# Patient Record
Sex: Female | Born: 1960 | Race: White | Hispanic: No
Health system: Southern US, Community
[De-identification: ages and names within clinical notes are randomized; demographics above are authoritative.]

---

## 2005-10-30 ENCOUNTER — Ambulatory Visit: Payer: Self-pay | Admitting: Internal Medicine

## 2005-10-30 ENCOUNTER — Inpatient Hospital Stay (HOSPITAL_BASED_OUTPATIENT_CLINIC_OR_DEPARTMENT_OTHER): Admission: RE | Admit: 2005-10-30 | Discharge: 2005-10-30 | Payer: Self-pay | Admitting: Cardiology

## 2010-09-03 ENCOUNTER — Encounter (HOSPITAL_COMMUNITY)
Admission: RE | Admit: 2010-09-03 | Discharge: 2010-09-03 | Disposition: A | Discharge status: Home / Self Care | Payer: BC Managed Care – PPO | Source: Ambulatory Visit | Attending: Obstetrics and Gynecology | Admitting: Obstetrics and Gynecology

## 2010-09-03 DIAGNOSIS — Z01812 Encounter for preprocedural laboratory examination: Secondary | ICD-10-CM | POA: Insufficient documentation

## 2010-09-03 LAB — CBC
MCHC: 32.9 g/dL (ref 30.0–36.0)
Platelets: 333 10*3/uL (ref 150–400)
RDW: 14 % (ref 11.5–15.5)
WBC: 10.2 10*3/uL (ref 4.0–10.5)

## 2010-09-03 LAB — URINALYSIS, ROUTINE W REFLEX MICROSCOPIC
Leukocytes, UA: NEGATIVE
Nitrite: NEGATIVE
Protein, ur: NEGATIVE mg/dL
Specific Gravity, Urine: 1.015 (ref 1.005–1.030)
Urobilinogen, UA: 0.2 mg/dL (ref 0.0–1.0)

## 2010-09-03 LAB — COMPREHENSIVE METABOLIC PANEL
Alkaline Phosphatase: 70 U/L (ref 39–117)
BUN: 6 mg/dL (ref 6–23)
Creatinine, Ser: 0.74 mg/dL (ref 0.4–1.2)
Glucose, Bld: 98 mg/dL (ref 70–99)
Potassium: 3.8 mEq/L (ref 3.5–5.1)
Total Protein: 7.4 g/dL (ref 6.0–8.3)

## 2010-09-03 LAB — SURGICAL PCR SCREEN
MRSA, PCR: NEGATIVE
Staphylococcus aureus: NEGATIVE

## 2010-09-03 LAB — URINE MICROSCOPIC-ADD ON

## 2010-09-10 ENCOUNTER — Inpatient Hospital Stay (HOSPITAL_COMMUNITY)
Admission: RE | Admit: 2010-09-10 | Discharge: 2010-09-12 | DRG: 359 | Disposition: A | Discharge status: Home / Self Care | Payer: BC Managed Care – PPO | Source: Ambulatory Visit | Attending: Obstetrics and Gynecology | Admitting: Obstetrics and Gynecology

## 2010-09-10 ENCOUNTER — Other Ambulatory Visit: Payer: Self-pay | Admitting: Obstetrics and Gynecology

## 2010-09-10 DIAGNOSIS — N949 Unspecified condition associated with female genital organs and menstrual cycle: Secondary | ICD-10-CM | POA: Diagnosis present

## 2010-09-10 DIAGNOSIS — N8000 Endometriosis of the uterus, unspecified: Secondary | ICD-10-CM | POA: Diagnosis present

## 2010-09-10 DIAGNOSIS — N920 Excessive and frequent menstruation with regular cycle: Secondary | ICD-10-CM | POA: Diagnosis present

## 2010-09-10 DIAGNOSIS — D251 Intramural leiomyoma of uterus: Secondary | ICD-10-CM | POA: Diagnosis present

## 2010-09-10 DIAGNOSIS — D252 Subserosal leiomyoma of uterus: Principal | ICD-10-CM | POA: Diagnosis present

## 2010-09-10 DIAGNOSIS — Z01812 Encounter for preprocedural laboratory examination: Secondary | ICD-10-CM

## 2010-09-10 DIAGNOSIS — N80209 Endometriosis of unspecified fallopian tube, unspecified depth: Secondary | ICD-10-CM | POA: Diagnosis present

## 2010-09-10 DIAGNOSIS — N802 Endometriosis of fallopian tube: Secondary | ICD-10-CM | POA: Diagnosis present

## 2010-09-10 LAB — PREGNANCY, URINE: Preg Test, Ur: NEGATIVE

## 2010-09-11 LAB — BASIC METABOLIC PANEL
Calcium: 8.3 mg/dL — ABNORMAL LOW (ref 8.4–10.5)
Creatinine, Ser: 0.6 mg/dL (ref 0.4–1.2)
GFR calc Af Amer: >60 mL/min (ref >60)
Sodium: 134 mEq/L — ABNORMAL LOW (ref 135–145)

## 2010-09-11 LAB — CBC
MCH: 30.4 pg (ref 26.0–34.0)
MCHC: 32.4 g/dL (ref 30.0–36.0)
Platelets: 298 10*3/uL (ref 150–400)
RBC: 3.68 MIL/uL — ABNORMAL LOW (ref 3.87–5.11)

## 2010-09-27 NOTE — Op Note (Signed)
NAMEDAVEN, PINCKNEY             ACCOUNT NO.:  [id number]  MEDICAL RECORD NO.:  [id number]           PATIENT TYPE:  I  LOCATION:  9319                          FACILITY:  [facility]  PHYSICIAN:  Randye Lobo, M.D.   DATE OF BIRTH:  1961-05-01  DATE OF PROCEDURE:  09/10/2010 DATE OF DISCHARGE:                              OPERATIVE REPORT   PREOPERATIVE DIAGNOSIS:  Symptomatic uterine fibroids.  POSTOPERATIVE DIAGNOSIS:  Symptomatic uterine fibroids.  PROCEDURE:  Total abdominal hysterectomy with bilateral salpingo- oophorectomy.  SURGEON:  Randye Lobo, MD  ASSISTANT:  Luvenia Redden, MD  ANESTHESIA:  Spinal epidural.  IV FLUIDS:  2000 mL Ringer's lactate.  ESTIMATED BLOOD LOSS:  150 mL.  URINE OUTPUT:  225 mL.  COMPLICATIONS:  None.  INDICATIONS FOR PROCEDURE:  The patient is a 50 year old para 2 Caucasian female, status post bilateral tubal ligation who has known uterine fibroids and who now presents with painful and heavy menstruation.  The patient has failed Lysteda treatment.  Recently, she has developed prolonged and heavy bleeding.  An ultrasound documented a multi-fibroid uterus.  The endometrial stripe was 12.4 mm.  The ovaries were not well visualized.  The patient's hemoglobin was 13.5 and her platelet count was 433,000.  Her uterus on examination felt consistent with approximately 13 weeks' size uterus.  It was not possible to palpate the ovaries.  The cervix was distorted and felt flash with the vagina and the os could not be identified in order to perform a preoperative endometrial biopsy.  Plan is now made to proceed with a total abdominal hysterectomy with possible bilateral salpingo- oophorectomy after risks, benefits, and alternatives have been reviewed.  FINDINGS:  The patient had a multi-fibroid uterus weighing 825.5 g. There was endometriosis of the uterine serosa in different locations. The fallopian tubes were consistent with prior bilateral  tubal ligation, but were otherwise normal.  The ovaries were unremarkable.  There was no evidence of any adhesive disease nor endometriosis in the abdomen or the pelvis.  The liver, gallbladder, spleen, and kidneys palpated to be normal.  SPECIMEN:  The uterus, cervix, bilateral tubes and ovaries were sent to pathology.  PROCEDURE:  The patient was reidentified in the preoperative hold area. She received cefotetan IV for antibiotic prophylaxis and she received TED hose and PAS stockings for DVT prophylaxis.  The patient was taken to the operating room where a spinal epidural combine anesthetic was administered.  The patient was placed in the dorsal lithotomy position.  The abdomen and vagina were then sterilely prepped and a Foley catheter was placed in the bladder.  She was sterilely draped.  The procedure began by creating a Pfannenstiel incision with a scalpel. Dissection was carried down to the fascia using monopolar cautery.  The fascia was incised in the midline with a scalpel and the incision was extended with the Mayo scissors bilaterally.  The rectus muscles were dissected off of the fascia superiorly and inferiorly.  The rectus muscles were sharply divided in the midline.  The parietal peritoneum was elevated with two hemostat clamps and entered sharply.  The peritoneal incision was extended cranially  and caudally.  An examination of the pelvis was performed.  A self-retaining retractor was placed.  The bowel was packed into the upper abdomen with moistened lap pads.  The procedure began with grasping the adnexal structures in the round ligament on the patient's left-hand side.  The left round ligament was then ligated with a transfixing suture of 0 Vicryl.  The ligament was bisected with monopolar cautery.  The broad ligament was opened anteriorly and posteriorly with monopolar cautery.  The ureter was identified in the left-hand side.  The left infundibulopelvic  ligament was doubly clamped and sharply divided.  The pedicle was tied with a free tie of 0 Vicryl followed by suture ligature of the same.  The bladder flap was taken down using monopolar cautery on the patient's left-hand side.  The same procedure that was performed on the left-hand side was then repeated on the right-hand side again after the right ureter was identified.  The bladder flap was further taken down in the midline anteriorly.  The uterine arteries were each skeletonized.  The fundus was delivered up through the uterine incision.  The uterine arteries were then doubly clamped, sharply divided, and suture ligated with 0 Vicryl bilaterally.  Amputation of the fundus from the cervix was performed at this time with monopolar cautery and the uterine fundus was set aside.  The remaining aspects of the cardinal ligaments were then clamped, sharply divided and suture ligated with transfixing sutures of 0 Vicryl. The dissection was carried down to the uterosacral ligaments, which were then clamped with a curved Heaney clamp, sharply divided, and suture ligated on each side again with 0 Vicryl.  Entry into the vagina was successful at this point.  A Jorgenson scissors was used to circumscribe the remaining cervix from the vagina.  The cervix was then sent to pathology with the remaining specimen.  Angle sutures of 0 Vicryl were created bilaterally and the vaginal cuff was then closed with a combination of figure-of-eight sutures of 0 Vicryl and then one simple suture to close the final aspect of the cuff.  Warm irrigation was then used to irrigate the pelvis.  Operative sites were hemostatic.  The abdomen was closed.  The lap pads and the self-retaining retractor were removed.  An exploration of the upper abdomen was performed and the findings are as noted above.  The parietal peritoneum was closed with a running suture of 3-0 Vicryl. The rectus muscles were reapproximated in  the midline with simple interrupted sutures of 0 Vicryl.  The fascia was closed with 0 Vicryl. The subcutaneous layer was closed with interrupted sutures of 2-0 plain gut suture.  The skin was closed with a subcuticular suture of 3-0 Vicryl.  Octylseal was placed over the skin incision.  The patient was taken out of the lithotomy position and escorted to the recovery room in stable and awake condition.  There were no complications to the procedure.  All needle, instrument, and sponge counts were correct.     Randye Lobo, M.D.     BES/MEDQ  D:  09/10/2010  T:  09/11/2010  Job:  [id number]  Electronically Signed by Conley Simmonds M.D. on 09/26/2010 10:59:15 AM

## 2010-09-27 NOTE — Discharge Summary (Signed)
[name], [name]             ACCOUNT NO.:  [id number]  MEDICAL RECORD NO.:  [id number]           PATIENT TYPE:  I  LOCATION:  9319                          [facility]ACILITY:  [facility]  PHYSICIAN:  Randye Lobo, M.D.   DATE OF BIRTH:  November 11, 1960  DATE OF ADMISSION:  09/10/2010 DATE OF DISCHARGE:  09/12/2010                              DISCHARGE SUMMARY   ADMISSION DIAGNOSIS:  Symptomatic uterine fibroids.  DISCHARGE DIAGNOSES: 1. Symptomatic uterine fibroids. 2. Suspected endometriosis. 3. Status post total abdominal hysterectomy with bilateral salpingo-     oophorectomy.  SIGNIFICANT OPERATIONS AND PROCEDURES:  The patient underwent a total abdominal hysterectomy with bilateral salpingo-oophorectomy on September 10, 2010, at the [facility] under the direction of Dr. Conley Simmonds with the assistance of Dr. Lodema Hong.  ADMISSION HISTORY AND PHYSICAL EXAMINATION:  The patient is a 50 year old para 2 Caucasian female, status post bilateral tubal ligation, who has known uterine fibroids and has been treated with Lysteda therapy, who presents with painful and heavy menstruation.  The patient is now having continuous vaginal bleeding.  Ultrasound documents a multi- fibroid uterus.  The endometrial stripe measured 12.4 mm and the ovaries were not visualized.  The patient's hemoglobin was 13.5.  TSH was normal.  The patient requested hysterectomy procedure after reviewing options.  The patient's medical history is significant for chronic bronchitis and tobacco use.  PHYSICAL EXAMINATION:  PELVIC:  The patient is noted to have a 12-13 weeks' size uterus.  The cervix was shortened and flushed with the vagina, making an endometrial biopsy not possible.  There were no adnexal masses appreciated.  HOSPITAL COURSE:  The patient was admitted on September 10, 2010, at which time, she underwent a total abdominal hysterectomy with bilateral salpingo-oophorectomy which was  performed without complications. Findings at the time of surgery included a multi-fibroid uterus, fallopian tubes consistent with a prior bilateral tubal ligation, and blue lesions of endometriosis scattered over the uterine serosal surface.  Postoperatively, the patient had a benign surgical recovery.  She was slowly weaned off her O2 therapy.  Her oxygen saturation on room air at this time is 91% and she is in no acute distress.  Her lungs are clear to auscultation.  The patient had control of her pain postoperatively with an epidural which was placed at the time of surgery.  She was converted over to oral pain medication on postop day #1 and she is tolerating this well.  The patient's diet is advanced to normal.  She is ambulating independently. She has had returned bladder and bowel function.  The patient's incision remains clean, dry, and intact.  Her postop day #1 hemoglobin is 11.2 and she is tolerating this well.  The patient's final pathology report is pending at the time of her discharge.  The patient is found to be in good condition and ready for discharge on postop day #2.  INSTRUCTIONS AT DISCHARGE: 1. Discharge to home. 2. The patient will follow a regular diet. 3. The patient will take the following medications:     a.     Percocet 5 mg/325 mg 1-2 p.o.  q.4-6 h. p.r.n. pain.     b.     Ibuprofen 600 mg p.o. q.6 h. p.r.n. pain. 4. The patient will have decreased activity for 6 weeks.  She will not     drive for 2 weeks.  She will not have sexual activity for 6 weeks. 5. The patient will follow up in the office in 4 weeks' time for her     postoperative check and for assessment for estrogen therapy. 6. The patient will call if she experiences problems with fever,     nausea and vomiting, pain uncontrolled by her medication,     incisional drainage or redness, or any other concern.     Randye Lobo, M.D.     BES/MEDQ  D:  09/12/2010  T:  09/12/2010  Job:   [id number]  Electronically Signed by Conley Simmonds M.D. on 09/26/2010 09:03:16 AM

## 2010-09-27 NOTE — H&P (Signed)
[name], [name]             ACCOUNT NO.:  [id number]  MEDICAL RECORD NO.:  [id number]         PATIENT TYPE:  WAMB  LOCATION:                                [facility]ILITY[facility]  PHYSICIAN:  Randye Lobo, M.D.   DATE OF BIRTH:  04/26/1961  DATE OF ADMISSION: DATE OF DISCHARGE:                             HISTORY & PHYSICAL   CHIEF COMPLAINT:  Heavy and painful menstruation.  HISTORY OF PRESENT ILLNESS:  The patient is a 50 year old, gravida 2, para 2, Caucasian female, status post bilateral tubal ligation, who presents with known uterine fibroid and heavy and painful menses.  The patient has been followed by her primary gynecologist, Dr. Lodema Hong for treatment of uterine fibroid first noted on pelvic ultrasound in 2009.  The patient's bleeding has been controlled by Lysteda therapy.  The patient recently experienced an episode of heavy and prolonged bleeding, which began in January 2012.  The patient was seen at the emergency department at [facility] on August 12, 2010, and then had followup in the office on August 14, 2010, at which time she was noted to have an enlarged uterus due to multiple fibroids ranging in size from 2.9-5.9 centimeters.  The ovaries were not visualized at the time of the examination.  The patient's endometrial stripe was 12.4 mm. The patient's hemoglobin at that time was 13.5 and her platelet count was 433,000.  The patient's bleeding has continued, and she has requested hysterectomy therapy for definitive treatment of bleeding and fibroids.  She is a smoker.  She declines any medical therapy.  The patient is reporting low back pain, difficulty voiding, urinary frequency, and persistent vaginal bleeding and spotting.  PAST OBSTETRIC AND GYNECOLOGIC HISTORY: 1. Status post vaginal delivery x2. 2. Status post bilateral tubal ligation. 3. The patient's last Pap smear was performed on August 21, 2010, and     was within normal  limits.  PAST MEDICAL HISTORY: 1. Chronic bronchitis.  The patient uses Spiriva. 2. Tobacco use.  The patient has smoked one pack per day for at least     30 years.  MEDICATIONS:  Lysteda 650 mg 2 p.o. t.i.d. p.r.n., heavy menstruation and Spiriva.  ALLERGIES:  SULFA and MORPHINE are listed as allergies, although the patient is uncertain of her reactions to these medications.  SOCIAL HISTORY:  The patient is single.  She is smokes.  The patient works for [employer].  The patient smokes one pack per day cigarettes.  FAMILY HISTORY:  There is no family history of any anesthetic reaction or bleeding problems.  PHYSICAL EXAMINATION:  VITAL SIGNS:  Height 5 feet 5 inches, weight 123 pounds, blood pressure 120/80. HEENT:  Normocephalic and atraumatic. LUNGS:  Clear to auscultation bilaterally. HEART:  S1 and S2 with a regular rate and rhythm. ABDOMEN:  Soft and nontender.  There is a mass that is palpable at 2 cm above the symphysis pubis.  There is no evidence of hepatosplenomegaly. BREAST:  There is no evidence of any dominant masses, skin retractions, nipple discharge, or axillary adenopathy. LYMPHATIC:  There is no evidence of any enlargement node of the neck, axilla,  or groin. PELVIC:  Normal external genitalia urethra.  The vagina demonstrates no lesions.  The cervix appears to be very shortened and essentially is flush with the vagina.  The uterus appears to be 12-13 weeks size and is fairly round in shape and feels the pelvis.  The ovaries are not palpated separately from the uterus.  There is no tenderness noted.  The cervical os was not possible to identified.  PREOPERATIVE LABORATORY STUDIES:  TSH on August 22, 2010, was 4.018.  IMPRESSION:  The patient is a 50 year old, para 2, Caucasian female, status post bilateral tubal ligation, who presents with symptomatic uterine fibroids.  PLAN:  The patient will undergo a total abdominal hysterectomy with  a possibility of a supracervical hysterectomy and possible bilateral salpingo-oophorectomy at the [facility] [place] on September 10, 2010.  Risks, benefits, and alternatives have been reviewed with the patient who wishes to proceed.     Randye Lobo, M.D.     BES/MEDQ  D:  09/09/2010  T:  09/09/2010  Job:  [id number]  Electronically Signed by Conley Simmonds M.D. on 09/26/2010 09:03:48 AM

## 2013-01-07 ENCOUNTER — Other Ambulatory Visit: Payer: Self-pay | Admitting: Obstetrics and Gynecology

## 2020-06-16 ENCOUNTER — Emergency Department (HOSPITAL_COMMUNITY)
Admission: EM | Admit: 2020-06-16 | Discharge: 2020-06-16 | Disposition: A | Discharge status: Home / Self Care | Payer: BC Managed Care – PPO | Attending: Emergency Medicine | Admitting: Emergency Medicine

## 2020-06-16 ENCOUNTER — Emergency Department (HOSPITAL_COMMUNITY): Payer: BC Managed Care – PPO

## 2020-06-16 ENCOUNTER — Other Ambulatory Visit: Payer: Self-pay

## 2020-06-16 ENCOUNTER — Encounter (HOSPITAL_COMMUNITY): Payer: Self-pay | Admitting: Physician Assistant

## 2020-06-16 DIAGNOSIS — R4589 Other symptoms and signs involving emotional state: Secondary | ICD-10-CM

## 2020-06-16 DIAGNOSIS — R531 Weakness: Secondary | ICD-10-CM | POA: Diagnosis present

## 2020-06-16 DIAGNOSIS — R2990 Unspecified symptoms and signs involving the nervous system: Secondary | ICD-10-CM

## 2020-06-16 LAB — DIFFERENTIAL
Abs Immature Granulocytes: 0.01 10*3/uL (ref 0.00–0.07)
Basophils Absolute: 0.1 10*3/uL (ref 0.0–0.1)
Basophils Relative: 1 %
Eosinophils Absolute: 0.4 10*3/uL (ref 0.0–0.5)
Eosinophils Relative: 6 %
Immature Granulocytes: 0 %
Lymphocytes Relative: 37 %
Lymphs Abs: 2.6 10*3/uL (ref 0.7–4.0)
Monocytes Absolute: 0.5 10*3/uL (ref 0.1–1.0)
Monocytes Relative: 7 %
Neutro Abs: 3.4 10*3/uL (ref 1.7–7.7)
Neutrophils Relative %: 49 %

## 2020-06-16 LAB — COMPREHENSIVE METABOLIC PANEL
ALT: 17 U/L (ref 0–44)
AST: 20 U/L (ref 15–41)
Albumin: 4 g/dL (ref 3.5–5.0)
Alkaline Phosphatase: 77 U/L (ref 38–126)
Anion gap: 11 (ref 5–15)
BUN: 5 mg/dL — ABNORMAL LOW (ref 6–20)
CO2: 24 mmol/L (ref 22–32)
Calcium: 9.6 mg/dL (ref 8.9–10.3)
Chloride: 101 mmol/L (ref 98–111)
Creatinine, Ser: 0.64 mg/dL (ref 0.44–1.00)
GFR, Estimated: >60 mL/min (ref >60)
Glucose, Bld: 99 mg/dL (ref 70–99)
Potassium: 4.1 mmol/L (ref 3.5–5.1)
Sodium: 136 mmol/L (ref 135–145)
Total Bilirubin: 0.5 mg/dL (ref 0.3–1.2)
Total Protein: 6.6 g/dL (ref 6.5–8.1)

## 2020-06-16 LAB — I-STAT CHEM 8, ED
BUN: 5 mg/dL — ABNORMAL LOW (ref 6–20)
Calcium, Ion: 1.16 mmol/L (ref 1.15–1.40)
Chloride: 100 mmol/L (ref 98–111)
Creatinine, Ser: 0.7 mg/dL (ref 0.44–1.00)
Glucose, Bld: 95 mg/dL (ref 70–99)
HCT: 45 % (ref 36.0–46.0)
Hemoglobin: 15.3 g/dL — ABNORMAL HIGH (ref 12.0–15.0)
Potassium: 4 mmol/L (ref 3.5–5.1)
Sodium: 136 mmol/L (ref 135–145)
TCO2: 25 mmol/L (ref 22–32)

## 2020-06-16 LAB — CBC
HCT: 44.2 % (ref 36.0–46.0)
Hemoglobin: 14.5 g/dL (ref 12.0–15.0)
MCH: 31.7 pg (ref 26.0–34.0)
MCHC: 32.8 g/dL (ref 30.0–36.0)
MCV: 96.5 fL (ref 80.0–100.0)
Platelets: 333 10*3/uL (ref 150–400)
RBC: 4.58 MIL/uL (ref 3.87–5.11)
RDW: 13.2 % (ref 11.5–15.5)
WBC: 7 10*3/uL (ref 4.0–10.5)
nRBC: 0 % (ref 0.0–0.2)

## 2020-06-16 LAB — APTT: aPTT: 28 seconds (ref 24–36)

## 2020-06-16 LAB — I-STAT BETA HCG BLOOD, ED (MC, WL, AP ONLY): I-stat hCG, quantitative: 16 m[IU]/mL — ABNORMAL HIGH (ref <5)

## 2020-06-16 LAB — PROTIME-INR
INR: 0.9 (ref 0.8–1.2)
Prothrombin Time: 11.5 seconds (ref 11.4–15.2)

## 2020-06-16 MED ORDER — SODIUM CHLORIDE 0.9% FLUSH
3.0000 mL | Freq: Once | INTRAVENOUS | Status: AC
Start: 2020-06-16 — End: 2020-06-16
  Administered 2020-06-16: 3 mL via INTRAVENOUS

## 2020-06-16 MED ORDER — ACETAMINOPHEN 325 MG PO TABS
650.0000 mg | ORAL_TABLET | Freq: Once | ORAL | Status: AC
  Administered 2020-06-16: 650 mg via ORAL
  Filled 2020-06-16: qty 2

## 2020-06-16 NOTE — ED Provider Notes (Signed)
[facility] EMERGENCY DEPARTMENT Provider Note   CSN: [id number] Arrival date & time: 06/16/20  1231     History No chief complaint on file.   [name] is a 59 y.o. female.  HPI Level 5 caveat Exam performed at bridge pre-CT scan  59 yo female presents today as code stroke.  Last known normal 2 days ago.  Code stroke was initiated prehospital by EMS.  Patient seen initially at present with neurologist who is proceeding with code stroke.  Patient reported that she had some weakness in her left arm.  Per neurologist this has resolved.  Patient is awake alert and speaking and airway appears clear.  No past medical history on file.  There are no problems to display for this patient.   History reviewed. No pertinent surgical history.   OB History   No obstetric history on file.     No family history on file.  Social History   Tobacco Use  . Smoking status: Not on file  Substance Use Topics  . Alcohol use: Not on file  . Drug use: Not on file    Home Medications Prior to Admission medications   Not on File    Allergies    Patient has no allergy information on record.  Review of Systems   Review of Systems  Unable to perform ROS: Acuity of condition    Physical Exam Updated Vital Signs There were no vitals taken for this visit.  Physical Exam Vitals and nursing note reviewed.  Constitutional:      Appearance: Normal appearance.  HENT:     Head: Normocephalic.     Right Ear: External ear normal.     Left Ear: External ear normal.     Nose: Nose normal.  Cardiovascular:     Rate and Rhythm: Normal rate.  Pulmonary:     Effort: Pulmonary effort is normal.  Musculoskeletal:        General: Normal range of motion.     Cervical back: Normal range of motion.  Skin:    General: Skin is warm and dry.     Capillary Refill: Capillary refill takes less than 2 seconds.  Neurological:     General: No focal deficit present.      Mental Status: She is alert.     Cranial Nerves: No cranial nerve deficit.     Sensory: No sensory deficit.     Motor: No weakness.     Coordination: Coordination normal.  Psychiatric:        Attention and Perception: Attention normal.        Mood and Affect: Affect is tearful.        Speech: Speech normal.        Behavior: Behavior normal.     ED Results / Procedures / Treatments   Labs (all labs ordered are listed, but only abnormal results are displayed) Labs Reviewed  PROTIME-INR  APTT  CBC  DIFFERENTIAL  COMPREHENSIVE METABOLIC PANEL  I-STAT CHEM 8, ED  CBG MONITORING, ED  I-STAT BETA HCG BLOOD, ED ([facility], [facility], [facility] ONLY)    EKG EKG Interpretation  Date/Time:  Saturday June 16 2020 13:07:59 EST Ventricular Rate:  63 PR Interval:    QRS Duration: 88 QT Interval:  424 QTC Calculation: 434 R Axis:   86 Text Interpretation: Sinus rhythm No old tracing to compare Confirmed by Deno Etienne [phone]) on 06/17/2020 10:30:44 AM   Radiology MR ANGIO HEAD WO CONTRAST  Result  Date: 06/16/2020 CLINICAL DATA:  Left-sided weakness and slurred speech. EXAM: MRI HEAD WITHOUT CONTRAST MRA HEAD WITHOUT CONTRAST TECHNIQUE: Multiplanar, multiecho pulse sequences of the brain and surrounding structures were obtained without intravenous contrast. Angiographic images of the head were obtained using MRA technique without contrast. COMPARISON:  Head CT 06/16/2020 and MRIs 04/26/2016 and 03/11/2016 FINDINGS: MRI HEAD FINDINGS Brain: There is no evidence of an acute infarct, intracranial hemorrhage, mass, midline shift, or extra-axial fluid collection. There is a small chronic cortical infarct in the right parietal lobe which occurred in 2017. The brain is otherwise normal in signal. The ventricles are normal. Vascular: Major intracranial vascular flow voids are preserved. Skull and upper cervical spine: Unremarkable bone marrow signal. Sinuses/Orbits: Unremarkable orbits. Mucosal thickening  throughout the paranasal sinuses including extensive bilateral ethmoid and right frontal sinus opacification. Clear mastoid air cells. Other: None. MRA HEAD FINDINGS The visualized distal vertebral arteries are widely patent to the basilar and codominant. Patent right PICA, bilateral AICA, and bilateral SCA origins are identified. The basilar artery is widely patent. There is a 2 mm aneurysm projecting superiorly from the basilar tip. Posterior communicating arteries are diminutive or absent. The PCAs are patent without evidence of a significant proximal stenosis. The internal carotid arteries are patent from skull base to carotid termini without evidence of significant stenosis. There is a 2 mm aneurysm projecting superiorly from the proximal left supraclinoid ICA. The cavernous segments are tortuous bilaterally. ACAs and MCAs are patent without evidence of a proximal branch occlusion or significant proximal stenosis. IMPRESSION: 1. No acute intracranial abnormality. 2. Small chronic right parietal infarct. 3. No major intracranial arterial occlusion or significant proximal stenosis. 4. 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 Electronically Signed   By: Logan Bores M.D.   On: 06/16/2020 15:55   MR BRAIN WO CONTRAST  Result Date: 06/16/2020 CLINICAL DATA:  Left-sided weakness and slurred speech. EXAM: MRI HEAD WITHOUT CONTRAST MRA HEAD WITHOUT CONTRAST TECHNIQUE: Multiplanar, multiecho pulse sequences of the brain and surrounding structures were obtained without intravenous contrast. Angiographic images of the head were obtained using MRA technique without contrast. COMPARISON:  Head CT 06/16/2020 and MRIs 04/26/2016 and 03/11/2016 FINDINGS: MRI HEAD FINDINGS Brain: There is no evidence of an acute infarct, intracranial hemorrhage, mass, midline shift, or extra-axial fluid collection. There is a small chronic cortical infarct in the right parietal lobe which occurred in 2017. The brain is  otherwise normal in signal. The ventricles are normal. Vascular: Major intracranial vascular flow voids are preserved. Skull and upper cervical spine: Unremarkable bone marrow signal. Sinuses/Orbits: Unremarkable orbits. Mucosal thickening throughout the paranasal sinuses including extensive bilateral ethmoid and right frontal sinus opacification. Clear mastoid air cells. Other: None. MRA HEAD FINDINGS The visualized distal vertebral arteries are widely patent to the basilar and codominant. Patent right PICA, bilateral AICA, and bilateral SCA origins are identified. The basilar artery is widely patent. There is a 2 mm aneurysm projecting superiorly from the basilar tip. Posterior communicating arteries are diminutive or absent. The PCAs are patent without evidence of a significant proximal stenosis. The internal carotid arteries are patent from skull base to carotid termini without evidence of significant stenosis. There is a 2 mm aneurysm projecting superiorly from the proximal left supraclinoid ICA. The cavernous segments are tortuous bilaterally. ACAs and MCAs are patent without evidence of a proximal branch occlusion or significant proximal stenosis. IMPRESSION: 1. No acute intracranial abnormality. 2. Small chronic right parietal infarct. 3. No major intracranial arterial occlusion  or significant proximal stenosis. 4. 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 Electronically Signed   By: Logan Bores M.D.   On: 06/16/2020 15:55   CT HEAD CODE STROKE WO CONTRAST  Result Date: 06/16/2020 CLINICAL DATA:  Code stroke.  Acute stroke suspected. EXAM: CT HEAD WITHOUT CONTRAST TECHNIQUE: Contiguous axial images were obtained from the base of the skull through the vertex without intravenous contrast. COMPARISON:  CT head 03/06/2016. FINDINGS: Brain: No evidence of acute large vascular territory infarction, hemorrhage, hydrocephalus, extra-axial collection or mass lesion/mass effect. Vascular: No  hyperdense vessel identified. Skull: No acute fracture. Sinuses/Orbits: Scattered paranasal sinus disease with opacification of the right frontal sinus and scattered ethmoid air cells. Unremarkable orbits. Other: No mastoid effusions. ASPECTS [facility] Stroke Program Early CT Score) total score (0-10 with 10 being normal): 10. IMPRESSION: 1. No evidence of acute intracranial abnormality. 2. ASPECTS is 10 3. Frontoethmoidal paranasal sinus disease. Dr. Erlinda Hong was paged at the time of dictation. Electronically Signed   By: Margaretha Sheffield MD   On: 06/16/2020 12:44    Procedures Procedures (including critical care time)  Medications Ordered in ED Medications  sodium chloride flush (NS) 0.9 % injection 3 mL (has no administration in time range)    ED Course  I have reviewed the triage vital signs and the nursing notes.  Pertinent labs & imaging results that were available during my care of the patient were reviewed by me and considered in my medical decision making (see chart for details).    MDM Rules/Calculators/A&P                          Patient signed out to oncoming provider Final Clinical Impression(s) / ED Diagnoses Final diagnoses:  Generalized weakness    Rx / DC Orders ED Discharge Orders    None       Pattricia Boss, MD 06/18/20 1218

## 2020-06-16 NOTE — ED Notes (Signed)
Patient walking to bathroom, gait steady

## 2020-06-16 NOTE — ED Provider Notes (Signed)
Received handoff from off going ED team at 1515.  Per previous team, the patient presented as code stroke, LKN 2 days ago, slurred speech and weakness. Weak all over. Normal neuro exam. Seen by Neuro. Emotional/social stress the past few days, likely related to stress/depression  Plan at time of handoff: -Follow-up MRI/MRA --> if negative then resume ASA and discharge if can ambulate.  Will need to follow-up with neurology and psychiatry   Physical Exam  BP 140/88   Pulse 64   Temp 98 F (36.7 C) (Oral)   Resp 17   Ht 5\' 5"  (1.651 m)   Wt 58.1 kg   SpO2 94%   BMI 21.30 kg/m   Physical Exam Vitals and nursing note reviewed.  Constitutional:      General: She is not in acute distress.    Appearance: She is well-developed.  HENT:     Head: Normocephalic and atraumatic.  Cardiovascular:     Rate and Rhythm: Normal rate and regular rhythm.     Heart sounds: No murmur heard.   Pulmonary:     Effort: Pulmonary effort is normal.  Skin:    General: Skin is warm and dry.  Neurological:     General: No focal deficit present.     Mental Status: She is alert and oriented to person, place, and time. Mental status is at baseline.     Cranial Nerves: No cranial nerve deficit.     Sensory: No sensory deficit.     Motor: No weakness.     Coordination: Coordination normal.     Gait: Gait normal.    MR BRAIN WO CONTRAST  Final Result    MR ANGIO HEAD WO CONTRAST  Final Result    CT HEAD CODE STROKE WO CONTRAST  Final Result      ED Course/Procedures     Procedures  MDM  59 year old female who presented as a code stroke.  CT head without contrast demonstrated no acute intracranial abnormality.  MRI brain and MRA head with small chronic right parietal infarct but no acute intracranial abnormality.  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  Aneurysms were discussed with the patient and she voiced understanding of need for outpatient  follow-up.  Discussed need for close outpatient follow-up with PCP, neurology, and a counselor.  Patient voiced understanding and stated she would be able to get a follow-up appointment with her PCP and she knows her counselor she would be able to contact.  Contact information for neurology placed in discharge paperwork patient agreed to call for follow-up visit.  Strict return precautions given including signs/symptoms that would warrant immediate return to the ED.  All questions answered.  Repeated neuro exam which was normal, normal gait.  Discharged in stable condition.         Darrick Huntsman, MD 06/16/20 1626    Pattricia Boss, MD 06/18/20 [phone]

## 2020-06-16 NOTE — ED Provider Notes (Signed)
[place] EMERGENCY DEPARTMENT Provider Note   CSN: [id number] Arrival date & time: 06/16/20  1231  An emergency department physician performed an initial assessment on this suspected stroke patient at 1230.  History Chief Complaint  Patient presents with  . Stroke Symptoms    [name] is a 60 y.o. female.  HPI   59 year old female presenting the emergency department today for evaluation as a code stroke.  Per [facility] EMS last known well was 5:00 PM 2 days ago.  She reportedly had some left-sided weakness and slurred speech.  The patient states that she just feels generally weak.  She denies any unilateral weakness but states she just feels confused.  With regards to reported confusion she states that she is having trouble thinking and it is taking her longer to do her normal activities and she is having trouble figuring out what she wants to say.  She denies any visual changes or problems with coordination.  She reports increased stress at home.  She currently lives with her daughter and states she has to take care of her.  She is tearful during this conversation.  She denies SI.  History reviewed. No pertinent past medical history.  There are no problems to display for this patient.   History reviewed. No pertinent surgical history.   OB History   No obstetric history on file.     No family history on file.  Social History   Tobacco Use  . Smoking status: Not on file  Substance Use Topics  . Alcohol use: Not on file  . Drug use: Not on file    Home Medications Prior to Admission medications   Medication Sig Start Date End Date Taking? Authorizing Provider  albuterol (VENTOLIN HFA) 108 (90 Base) MCG/ACT inhaler Inhale 2 puffs into the lungs 2 (two) times daily. 09/08/19  Yes [provider]  benzonatate (TESSALON) 100 MG capsule Take 100 mg by mouth every 6 (six) hours as needed for cough.   Yes [provider]    cyclobenzaprine (FLEXERIL) 5 MG tablet Take 5 mg by mouth at bedtime as needed for muscle spasms.   Yes [provider]  escitalopram (LEXAPRO) 20 MG tablet Take 20 mg by mouth at bedtime.  04/25/20  Yes [provider]  hydrOXYzine (VISTARIL) 25 MG capsule Take 25 mg by mouth 4 (four) times daily as needed for anxiety.    Yes [provider]  ondansetron (ZOFRAN-ODT) 4 MG disintegrating tablet Take 4 mg by mouth every 8 (eight) hours as needed for nausea or vomiting (DISSOLVE ORALLY).   Yes [provider]  oxyCODONE (OXY IR/ROXICODONE) 5 MG immediate release tablet Take 5 mg by mouth every 4 (four) hours as needed for severe pain.   Yes [provider]  simvastatin (ZOCOR) 10 MG tablet Take 10 mg by mouth at bedtime. 04/25/20  Yes [provider]  SPIRIVA HANDIHALER 18 MCG inhalation capsule Place 1 capsule into inhaler and inhale daily. 04/25/20  Yes [provider]    Allergies    Morphine and Sulfa antibiotics  Review of Systems   Review of Systems  Constitutional: Negative for chills and fever.  HENT: Negative for ear pain and sore throat.   Eyes: Negative for visual disturbance.  Respiratory: Negative for cough and shortness of breath.   Cardiovascular: Negative for chest pain.  Gastrointestinal: Negative for abdominal pain, constipation, diarrhea, nausea and vomiting.  Genitourinary: Negative for dysuria and hematuria.  Musculoskeletal:  Negative for back pain.  Skin: Negative for rash.  Neurological: Positive for weakness and headaches. Negative for seizures and syncope.  All other systems reviewed and are negative.   Physical Exam Updated Vital Signs BP 140/88   Pulse 64   Temp 98 F (36.7 C) (Oral)   Resp 17   Ht 5\' 5"  (1.651 m)   Wt 58.1 kg   SpO2 94%   BMI 21.30 kg/m   Physical Exam Vitals and nursing note reviewed.  Constitutional:      General: She is not in acute distress.    Appearance: She is  well-developed.  HENT:     Head: Normocephalic and atraumatic.  Eyes:     Conjunctiva/sclera: Conjunctivae normal.  Cardiovascular:     Rate and Rhythm: Normal rate and regular rhythm.     Heart sounds: Normal heart sounds. No murmur heard.   Pulmonary:     Effort: Pulmonary effort is normal. No respiratory distress.     Breath sounds: Normal breath sounds. No wheezing, rhonchi or rales.  Abdominal:     General: Bowel sounds are normal.     Palpations: Abdomen is soft.     Tenderness: There is no abdominal tenderness. There is no guarding or rebound.  Musculoskeletal:     Cervical back: Neck supple.  Skin:    General: Skin is warm and dry.  Neurological:     Mental Status: She is alert.     Comments: Mental Status:  Alert, oriented, no aphasia or dysarthria, thought content appropriate, able to give a coherent history. Able to follow 2 step commands without difficulty.  Cranial Nerves:  II:  pupils equal, round, reactive to light III,IV, VI: ptosis not present, extra-ocular motions intact bilaterally  V,VII: smile symmetric, facial light touch sensation equal VIII: hearing grossly normal to voice  X: uvula elevates symmetrically  XI: bilateral shoulder shrug symmetric and strong XII: midline tongue extension without fassiculations Motor:  Normal tone. 5/5 strength of BUE and BLE major muscle groups including strong and equal grip strength and dorsiflexion/plantar flexion Sensory: light touch normal in all extremities. Cerebellar: normal finger-to-nose with bilateral upper extremities   Psychiatric:        Mood and Affect: Affect is tearful.        Behavior: Behavior is withdrawn.     ED Results / Procedures / Treatments   Labs (all labs ordered are listed, but only abnormal results are displayed) Labs Reviewed  COMPREHENSIVE METABOLIC PANEL - Abnormal; Notable for the following components:      Result Value   BUN 5 (*)    All other components within normal limits    I-STAT CHEM 8, ED - Abnormal; Notable for the following components:   BUN 5 (*)    Hemoglobin 15.3 (*)    All other components within normal limits  I-STAT BETA HCG BLOOD, ED ([facility], [facility], [facility] ONLY) - Abnormal; Notable for the following components:   I-stat hCG, quantitative 16.0 (*)    All other components within normal limits  PROTIME-INR  APTT  CBC  DIFFERENTIAL  CBG MONITORING, ED    EKG None  Radiology CT HEAD CODE STROKE WO CONTRAST  Result Date: 06/16/2020 CLINICAL DATA:  Code stroke.  Acute stroke suspected. EXAM: CT HEAD WITHOUT CONTRAST TECHNIQUE: Contiguous axial images were obtained from the base of the skull through the vertex without intravenous contrast. COMPARISON:  CT head 03/06/2016. FINDINGS: Brain: No evidence of acute large vascular territory infarction, hemorrhage, hydrocephalus, extra-axial  collection or mass lesion/mass effect. Vascular: No hyperdense vessel identified. Skull: No acute fracture. Sinuses/Orbits: Scattered paranasal sinus disease with opacification of the right frontal sinus and scattered ethmoid air cells. Unremarkable orbits. Other: No mastoid effusions. ASPECTS [facility] Stroke Program Early CT Score) total score (0-10 with 10 being normal): 10. IMPRESSION: 1. No evidence of acute intracranial abnormality. 2. ASPECTS is 10 3. Frontoethmoidal paranasal sinus disease. Dr. Erlinda Hong was paged at the time of dictation. Electronically Signed   By: Margaretha Sheffield MD   On: 06/16/2020 12:44    Procedures Procedures (including critical care time)  Medications Ordered in ED Medications  sodium chloride flush (NS) 0.9 % injection 3 mL (3 mLs Intravenous Given 06/16/20 1331)  acetaminophen (TYLENOL) tablet 650 mg (650 mg Oral Given 06/16/20 1331)    ED Course  I have reviewed the triage vital signs and the nursing notes.  Pertinent labs & imaging results that were available during my care of the patient were reviewed by me and considered in my medical decision  making (see chart for details).    MDM Rules/Calculators/A&P                          59 year old female presenting the emergency department today for evaluation as a code stroke.  Reportedly is having left-sided weakness and difficulty speaking last known well was 2 days ago.  Code stroke was called by EMS.  12:50 PM Dr. Erlinda Hong with neurology evaluated the patient.  He states that he thinks that the patient has been depressed due to recent family issues but recommends MRI/MRA.  He states if this is negative he feels that the patient is likely appropriate for discharge and recommends resuming her ASA.  Reviewed/interpreted labs CBC unremarkable CMP unremarkable Coags negative  EKG with NSR  CT head reviewed/interpreted - neg for bleed or obvious cva  At shift change, patient pending MRI/MRA.  Care transition to resident, Dr. Ulanda Edison who will follow up on MRI/MRA.  If negative neuro feels patient safe for discharge home with resumption of her ASA.   Final Clinical Impression(s) / ED Diagnoses Final diagnoses:  None    Rx / DC Orders ED Discharge Orders    None       Bishop Dublin 06/16/20 1522    Pattricia Boss, MD 06/18/20 1219

## 2020-06-16 NOTE — ED Triage Notes (Signed)
Patient arrived via [facility] EMS from home stating that patient had weakness to her left side and slurred speech. Patient was last known normal on Thanksgiving at 1700.

## 2020-06-16 NOTE — Consult Note (Signed)
Stroke Neurology Consultation Note  Consult Requested by: Dr. Jeanell Sparrow  Reason for Consult: code stroke  Consult Date: 06/16/20   The history was obtained from the EMS and patient.  During history and examination, all items were able to obtain unless otherwise noted.  History of Present Illness:  [name] is a 59 y.o. Caucasian female with PMH of lupus, stroke, recent gallbladder surgery 5 weeks ago presented to ER for code stroke.  Per EMS, patient last seen well 5 PM on 06/14/2020.  One of her daughters came by yesterday found patient to be confused, not talking well, sleepy but patient refused to go to the hospital.  Today daughter came by to see her again found her to be worsening confusion, speech difficulty, EMS was called.  On arrival, EMS found patient also has some left arm drift.  Code stroke activated.  On arrival, patient seems in depressed mood, able to answer questions although slow responding, no obvious aphasia, moving all extremities equally.  CT no acute abnormality.  Patient was intermittently crying and emotional with EMS and in ER.  By talking to patient in details, she stated that she has 2 daughters, one lives with her made her upset.  However, patient did not want to further elaborate that.  For the last 2 days, patient in depressed mood, just want to sleep.  The daughter who does not live with her came to visit her for the last 2 days and found this changes, and called EMS.  Currently ER, patient neuro stable, she wants to go home.  Per patient, she takes Lexapro and Zocor at home.  She is supposed to take aspirin but she did not.  She says she had a stroke for 5 years ago in [facility] but no residual left.  Per record, she had recent gallbladder surgery and recovered well.  LSN: 5 PM on 06/14/2020 tPA Given: No: Outside window, NIH score 0, likely not stroke.  No family history on file.  Social History:  has no history on file for tobacco use, alcohol use, and  drug use.  Allergies:  Allergies  Allergen Reactions  . Morphine Itching  . Sulfa Antibiotics Itching and Rash    No current facility-administered medications on file prior to encounter.   Current Outpatient Medications on File Prior to Encounter  Medication Sig Dispense Refill  . albuterol (VENTOLIN HFA) 108 (90 Base) MCG/ACT inhaler Inhale 2 puffs into the lungs 2 (two) times daily.    . benzonatate (TESSALON) 100 MG capsule Take 100 mg by mouth every 6 (six) hours as needed for cough.    . cyclobenzaprine (FLEXERIL) 5 MG tablet Take 5 mg by mouth at bedtime as needed for muscle spasms.    Marland Kitchen escitalopram (LEXAPRO) 20 MG tablet Take 20 mg by mouth at bedtime.     . hydrOXYzine (VISTARIL) 25 MG capsule Take 25 mg by mouth 4 (four) times daily as needed for anxiety.     . ondansetron (ZOFRAN-ODT) 4 MG disintegrating tablet Take 4 mg by mouth every 8 (eight) hours as needed for nausea or vomiting (DISSOLVE ORALLY).    Marland Kitchen oxyCODONE (OXY IR/ROXICODONE) 5 MG immediate release tablet Take 5 mg by mouth every 4 (four) hours as needed for severe pain.    . simvastatin (ZOCOR) 10 MG tablet Take 10 mg by mouth at bedtime.    Marland Kitchen SPIRIVA HANDIHALER 18 MCG inhalation capsule Place 1 capsule into inhaler and inhale daily.  Review of Systems: A full ROS was attempted today and was able to be performed.  Systems assessed include - Constitutional, Eyes, HENT, Respiratory, Cardiovascular, Gastrointestinal, Genitourinary, Integument/breast, Hematologic/lymphatic, Musculoskeletal, Neurological, Behavioral/Psych, Endocrine, Allergic/Immunologic - with pertinent responses as per HPI.  Physical Examination: Temp:  [98 F (36.7 C)] 98 F (36.7 C) (11/27 1300) Pulse Rate:  [57-67] 64 (11/27 1430) Resp:  [12-22] 17 (11/27 1430) BP: (130-140)/(84-88) 140/88 (11/27 1430) SpO2:  [91 %-100 %] 94 % (11/27 1430) Weight:  [58.1 kg] 58.1 kg (11/27 1301)  General - well nourished, well developed, in no apparent  distress, however depressed mood.    Ophthalmologic - fundi not visualized due to noncooperation.    Cardiovascular - regular rhythm and rate  Mental Status -  Level of arousal and orientation to time, place, and person were intact. Language including expression, naming, repetition, comprehension, reading, and writing was assessed and found intact.  Mild psychomotor slowing, with depressed mood Fund of Knowledge was assessed and was intact.  Cranial Nerves II - XII - II - Vision intact OU. III, IV, VI - Extraocular movements intact. V - Facial sensation intact bilaterally. VII - Facial movement intact bilaterally. VIII - Hearing & vestibular intact bilaterally. X - Palate elevates symmetrically. XI - Chin turning & shoulder shrug intact bilaterally. XII - Tongue protrusion intact.  Motor Strength - The patient's strength was normal in all extremities and pronator drift was absent.   Motor Tone & Bulk - Muscle tone was assessed at the neck and appendages and was normal.  Bulk was normal and fasciculations were absent.   Reflexes - The patient's reflexes were normal in all extremities and she had no pathological reflexes.  Sensory - Light touch, temperature/pinprick were assessed and were normal.    Coordination - The patient had normal movements in the hands with no ataxia or dysmetria.  Tremor was absent.  Gait and Station - deferred  Data Reviewed: CT HEAD CODE STROKE WO CONTRAST  Result Date: 06/16/2020 CLINICAL DATA:  Code stroke.  Acute stroke suspected. EXAM: CT HEAD WITHOUT CONTRAST TECHNIQUE: Contiguous axial images were obtained from the base of the skull through the vertex without intravenous contrast. COMPARISON:  CT head 03/06/2016. FINDINGS: Brain: No evidence of acute large vascular territory infarction, hemorrhage, hydrocephalus, extra-axial collection or mass lesion/mass effect. Vascular: No hyperdense vessel identified. Skull: No acute fracture. Sinuses/Orbits:  Scattered paranasal sinus disease with opacification of the right frontal sinus and scattered ethmoid air cells. Unremarkable orbits. Other: No mastoid effusions. ASPECTS [facility] Stroke Program Early CT Score) total score (0-10 with 10 being normal): 10. IMPRESSION: 1. No evidence of acute intracranial abnormality. 2. ASPECTS is 10 3. Frontoethmoidal paranasal sinus disease. Dr. Erlinda Hong was paged at the time of dictation. Electronically Signed   By: Margaretha Sheffield MD   On: 06/16/2020 12:44    Assessment: 59 y.o. female PMH of lupus, stroke, recent gallbladder surgery 5 weeks ago presented to ER for code stroke. Patient last seen well 5 PM on 06/14/2020.  NIH score in ER 0.  Patient seem to have depressed mood due to family dynamics.  CT no acute abnormality.  Patient not TPA candidate given outside window, neck score 0, and likely not stroke.  Etiology per patient symptoms likely due to depressed mood, although stroke still cannot be completely ruled out given her history of stroke and lupus.  Recommend MRI brain and MRA head to rule out stroke.  If no stroke, patient can be discharged from neuro  standpoint if able to ambulate in ER without difficulty.  Recommend continue home aspirin and statin for stroke prevention.  Recommend close follow-up with outpatient neurology and psychiatry/psychology.  Stroke Risk Factors - History of stroke and lupus  Plan: - MRI, MRA  of the brain without contrast.  If no stroke found, patient can be discharged from neuro standpoint if able to ambulate in ER without difficulty. - Continue home aspirin 81 and statin for stroke prevention. - Risk factor modification - Close outpatient neurology and psychiatry/psychology follow-up. - Discussed with the EDP Dr. Reece Levy.  Thank you for this consultation and allowing [place] to participate in the care of this patient.  Rosalin Hawking, MD PhD Stroke Neurology 06/16/2020 3:10 PM

## 2020-06-16 NOTE — ED Notes (Signed)
Patient ambulated to the bathroom without any difficulty

## 2020-06-16 NOTE — ED Notes (Signed)
CBG 92; POC edit form completed.

## 2020-06-16 NOTE — ED Notes (Signed)
Patient off floor to MRI

## 2020-06-18 ENCOUNTER — Encounter (HOSPITAL_COMMUNITY): Payer: Self-pay | Admitting: Emergency Medicine

## 2020-06-18 LAB — CBG MONITORING, ED: Glucose-Capillary: 92 mg/dL (ref 70–99)

## 2021-03-27 IMAGING — MR MR MRA HEAD W/O CM
1 series · 19 of 48 positions shown · non-contrast
Comparison: Head CT 06/16/2020 and MRIs 04/26/2016 and 03/11/2016

CLINICAL DATA: Left-sided weakness and slurred speech.

EXAM:
MRI HEAD WITHOUT CONTRAST
MRA HEAD WITHOUT CONTRAST
TECHNIQUE: Multiplanar, multiecho pulse sequences of the brain and surrounding
structures were obtained without intravenous contrast. Angiographic
images of the head were obtained using MRA technique without
contrast.

[Series 9: 3d cow · axial · 0.5mm · 0.41mm/px · z∈[-90,-9]mm · 19 of 172 slices shown]
[im 1/172]
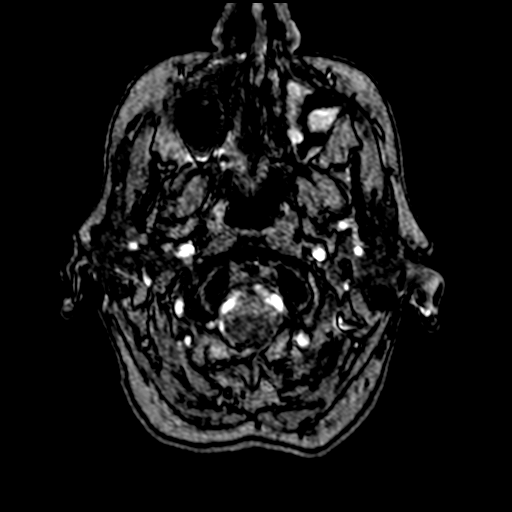
[im 4/172]
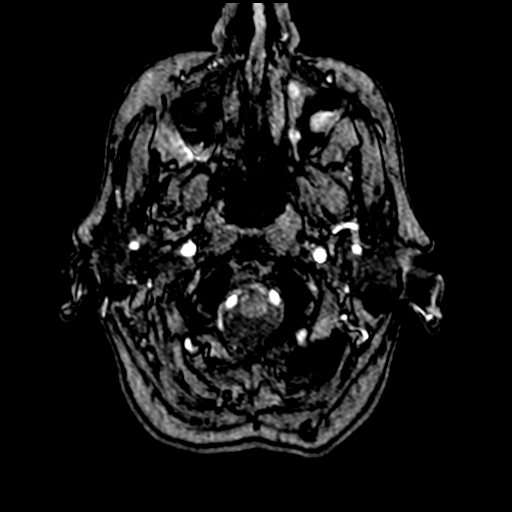
[im 8/172]
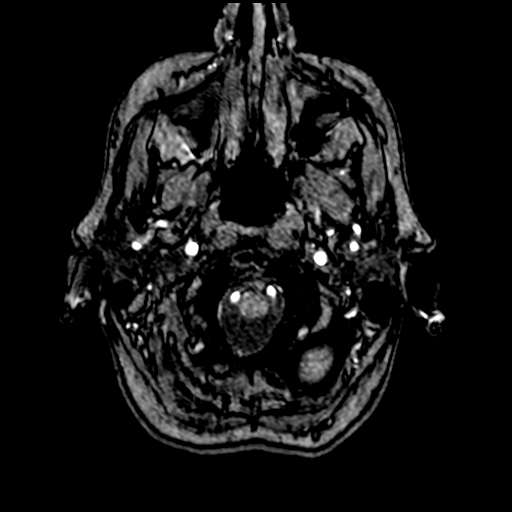
[im 11/172]
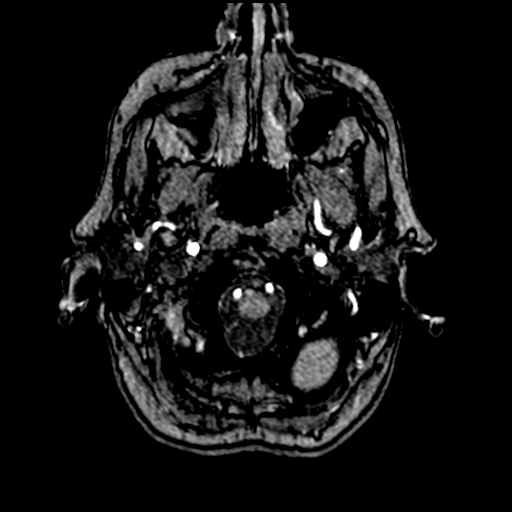
[im 15/172]
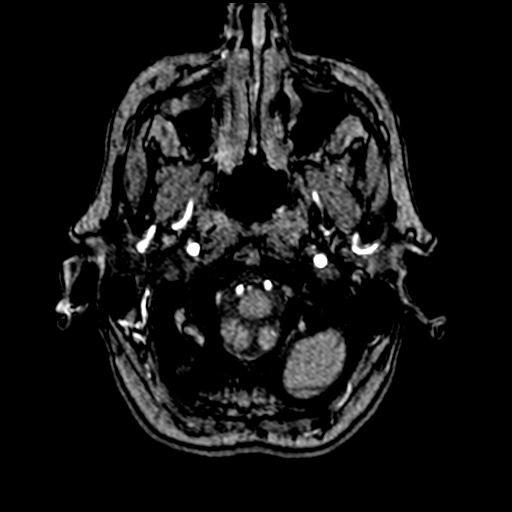
[im 19/172]
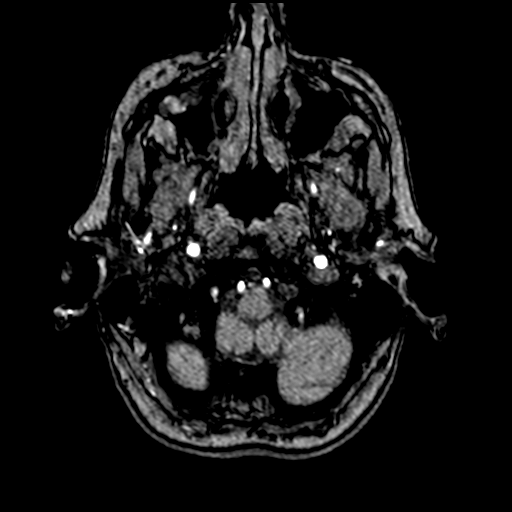
[im 22/172]
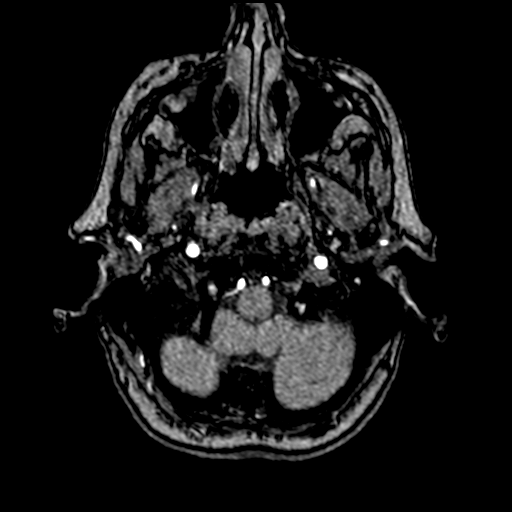
[im 26/172]
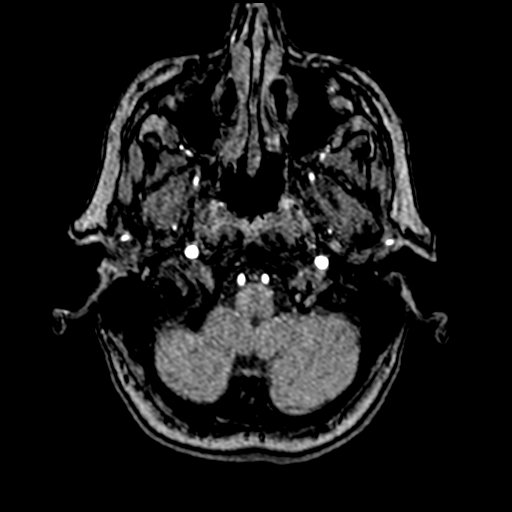
[im 30/172]
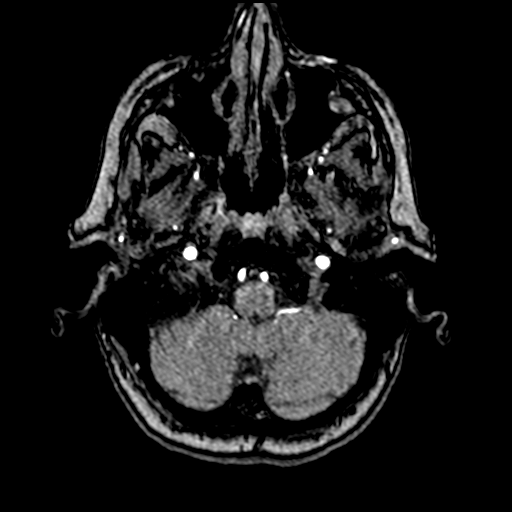
[im 33/172]
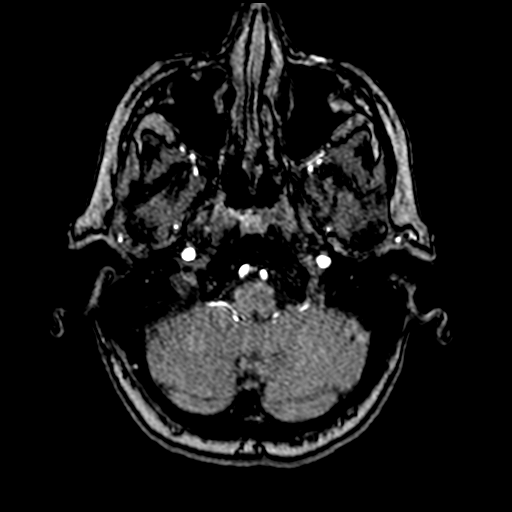
[im 37/172]
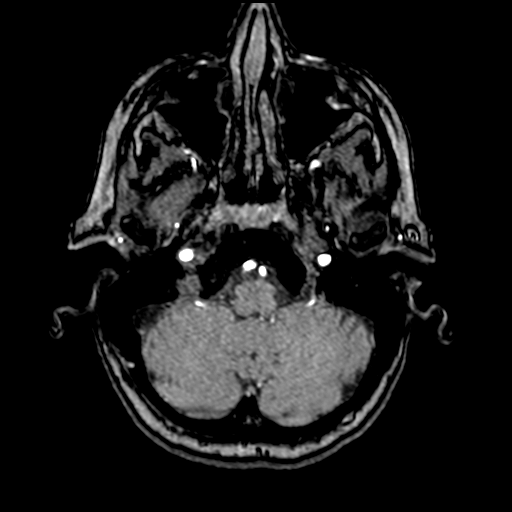
[im 55/172]
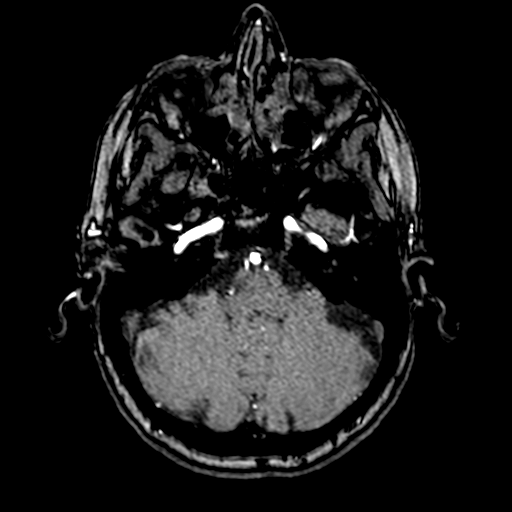
[im 77/172]
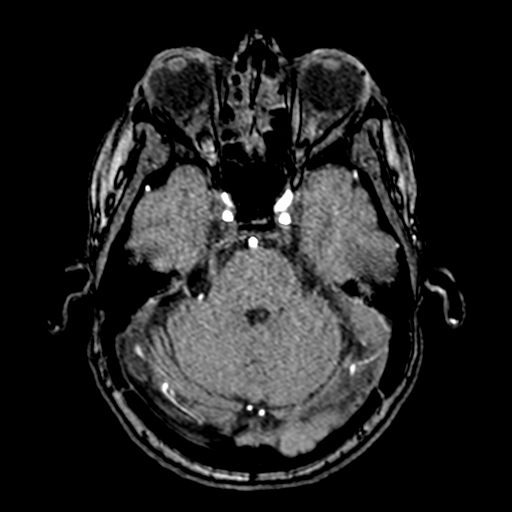
[im 88/172]
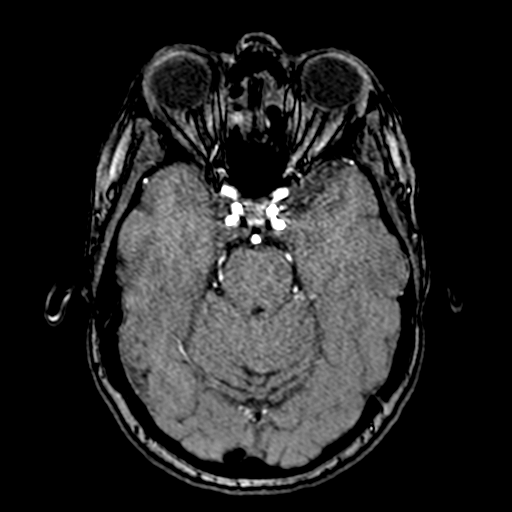
[im 99/172]
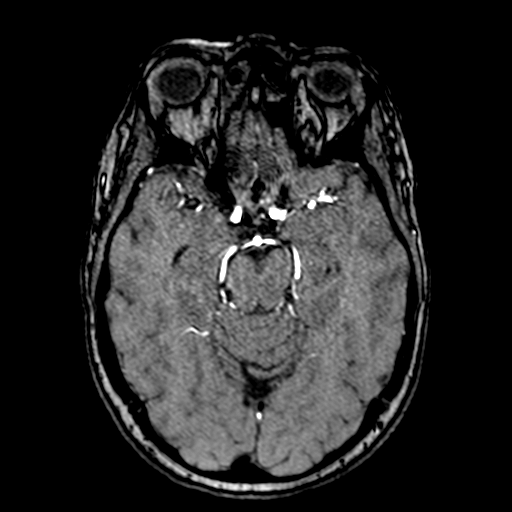
[im 121/172]
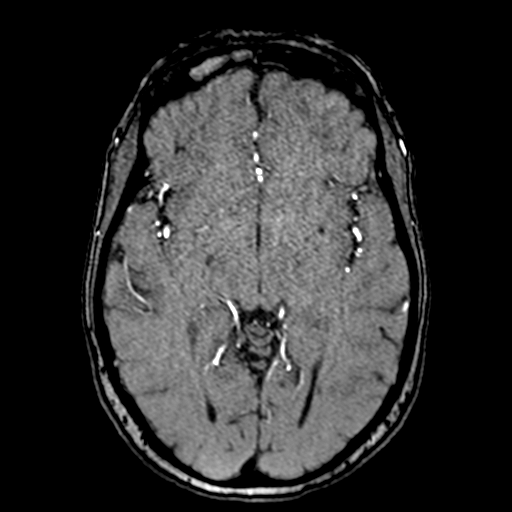
[im 142/172]
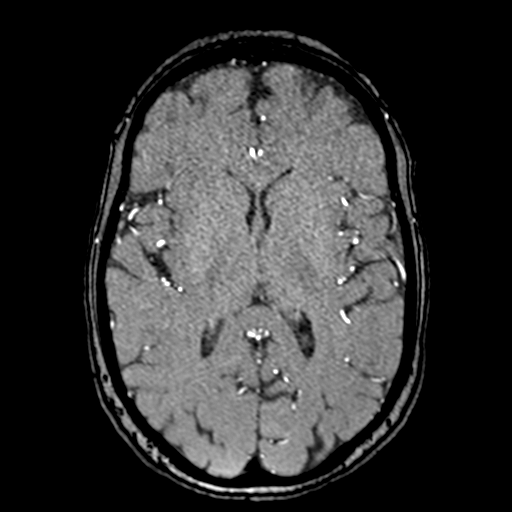
[im 146/172]
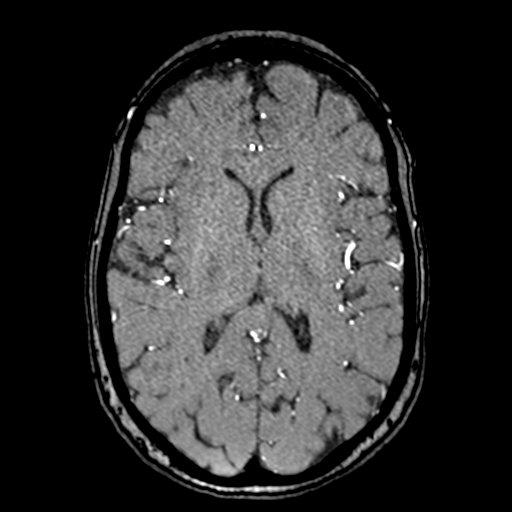
[im 164/172]
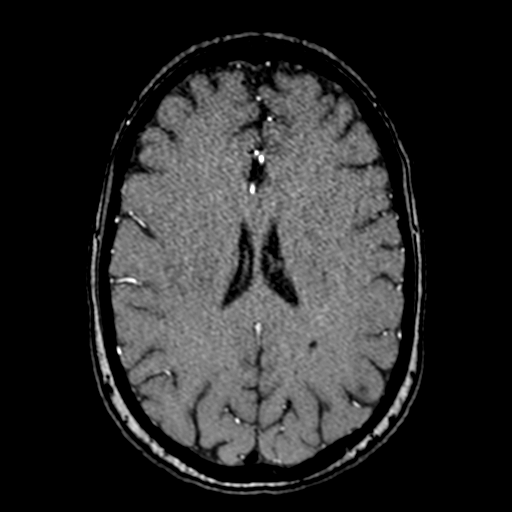

[19 of 48 positions shown; findings below may reference images not displayed]

FINDINGS: MRI HEAD FINDINGS

Brain: There is no evidence of an acute infarct, intracranial
hemorrhage, mass, midline shift, or extra-axial fluid collection.
There is a small chronic cortical infarct in the right parietal lobe
which occurred in 1666. The brain is otherwise normal in signal. The
ventricles are normal.

Vascular: Major intracranial vascular flow voids are preserved.

Skull and upper cervical spine: Unremarkable bone marrow signal.

Sinuses/Orbits: Unremarkable orbits. Mucosal thickening throughout
the paranasal sinuses including extensive bilateral ethmoid and
right frontal sinus opacification. Clear mastoid air cells.

Other: None.

MRA HEAD FINDINGS

The visualized distal vertebral arteries are widely patent to the
basilar and codominant. Patent right PICA, bilateral AICA, and
bilateral SCA origins are identified. The basilar artery is widely
patent. There is a 2 mm aneurysm projecting superiorly from the
basilar tip. Posterior communicating arteries are diminutive or
absent. The PCAs are patent without evidence of a significant
proximal stenosis.

The internal carotid arteries are patent from skull base to carotid
termini without evidence of significant stenosis. There is a 2 mm
aneurysm projecting superiorly from the proximal left supraclinoid
ICA. The cavernous segments are tortuous bilaterally. ACAs and MCAs
are patent without evidence of a proximal branch occlusion or
significant proximal stenosis.
IMPRESSION: 1. No acute intracranial abnormality.
2. Small chronic right parietal infarct.
3. No major intracranial arterial occlusion or significant proximal
stenosis.
4. 2 mm left supraclinoid ICA and basilar tip aneurysms.

## 2021-03-27 IMAGING — CT CT HEAD CODE STROKE
4 series · 16 of 47 positions shown, 18 images · non-contrast
Comparison: CT head 03/06/2016.

CLINICAL DATA: Code stroke.  Acute stroke suspected.

EXAM:
CT HEAD WITHOUT CONTRAST
TECHNIQUE: Contiguous axial images were obtained from the base of the skull
through the vertex without intravenous contrast.

[Series 3: head wo · axial · 0.39mm/px · z∈[-125,-20]mm · 7 of 29 slices shown, 9 images]
[im 4/29  brain]
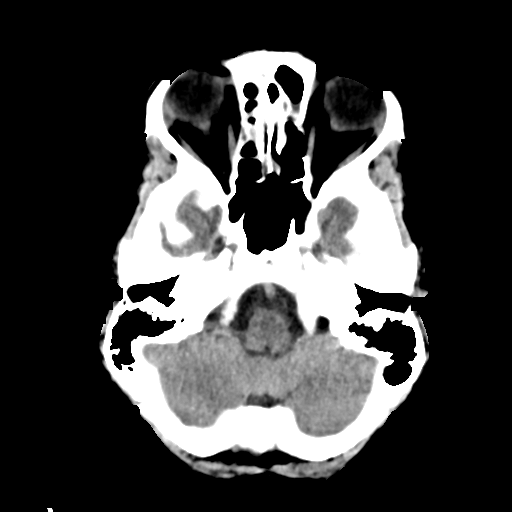
[im 4/29  bone]
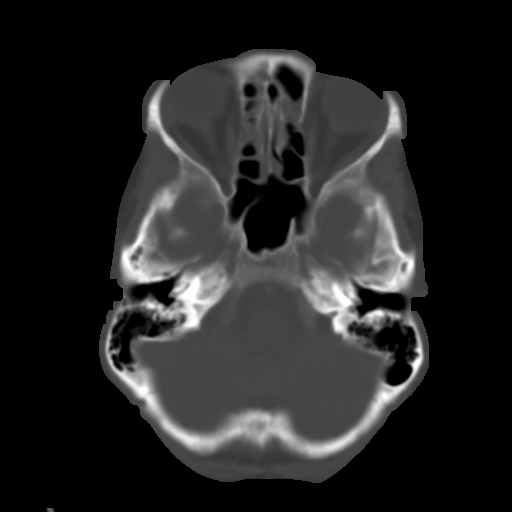
[im 8/29  brain]
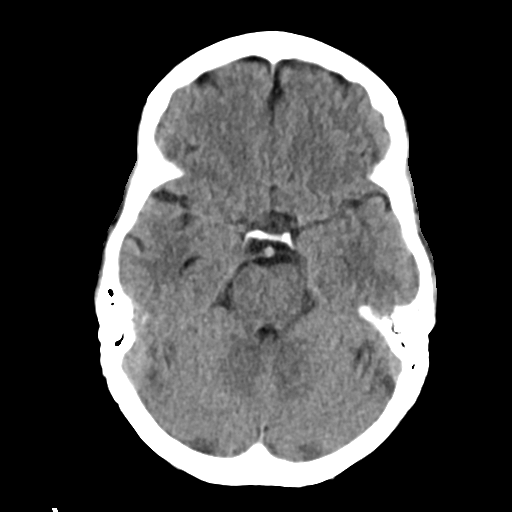
[im 11/29  brain]
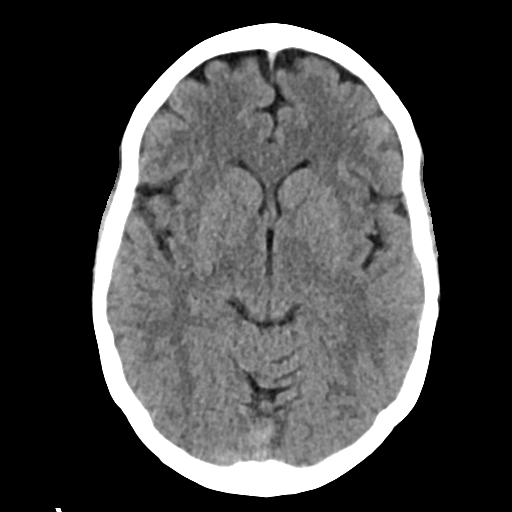
[im 15/29  brain]
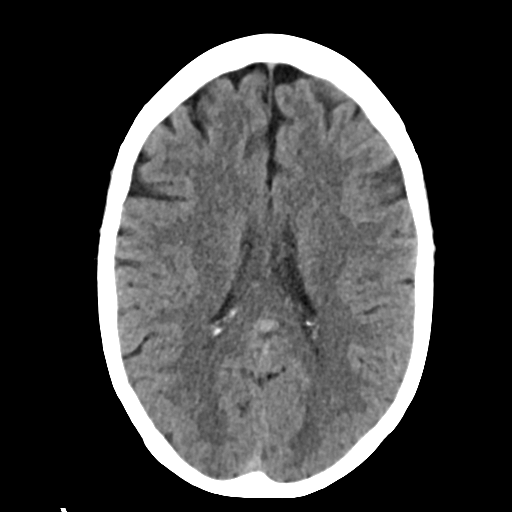
[im 18/29  brain]
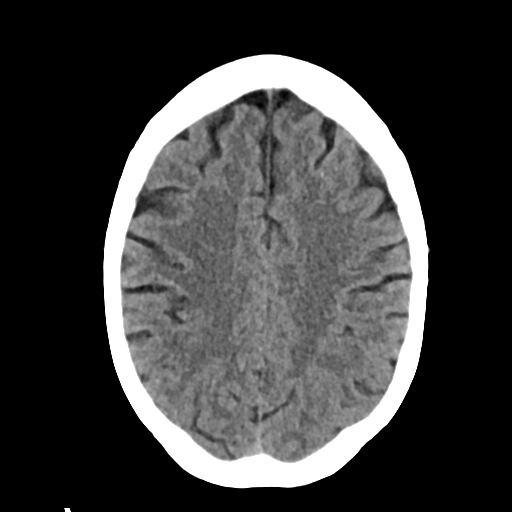
[im 18/29  bone]
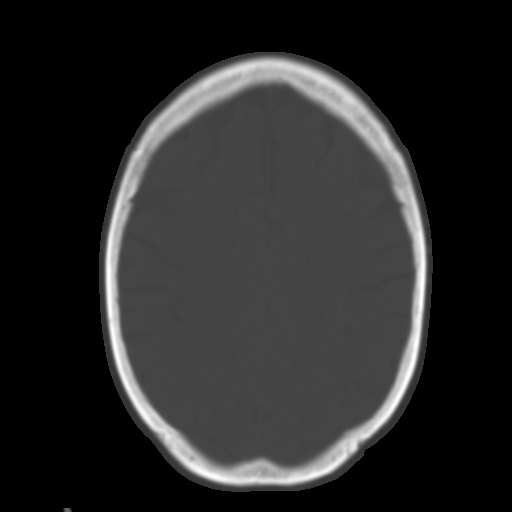
[im 22/29  brain]
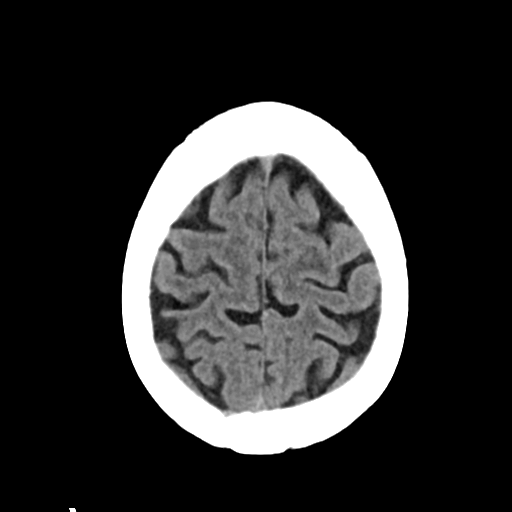
[im 25/29  brain]
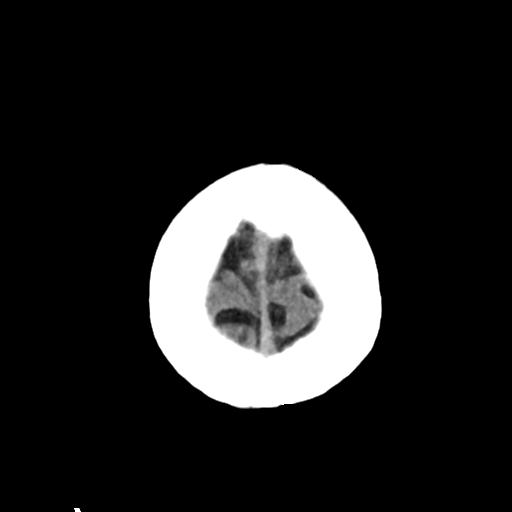

[Series 4: head bone · axial · 0.39mm/px · z∈[-126,-98]mm · 3 of 71 slices shown]
[im 8/71  bone]
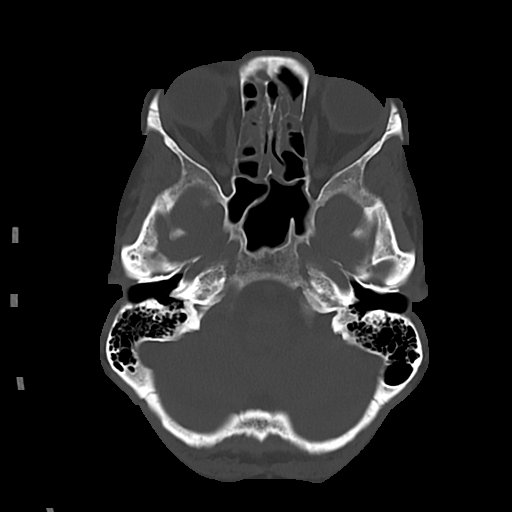
[im 15/71  bone]
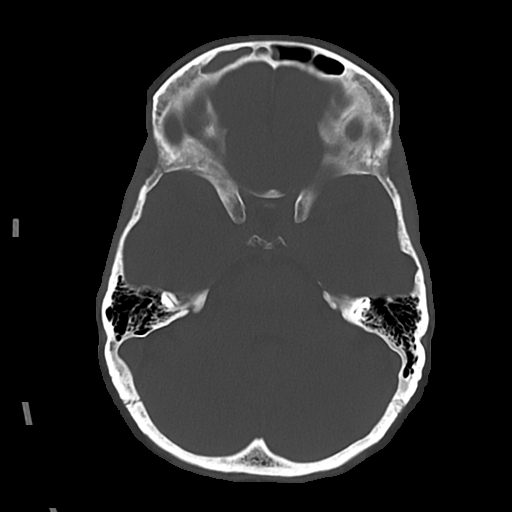
[im 22/71  bone]
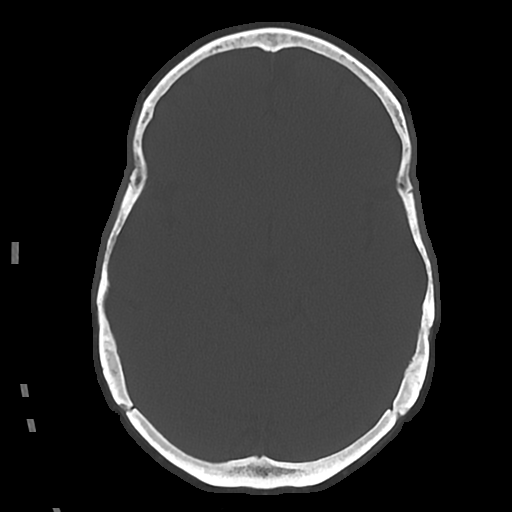

[Series 5: cor soft · coronal · 0.29mm/px · 3 of 65 slices shown]
[im 22/65  brain]
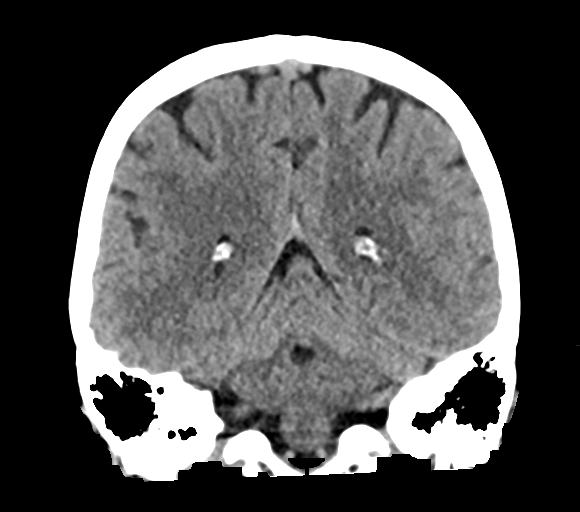
[im 29/65  brain]
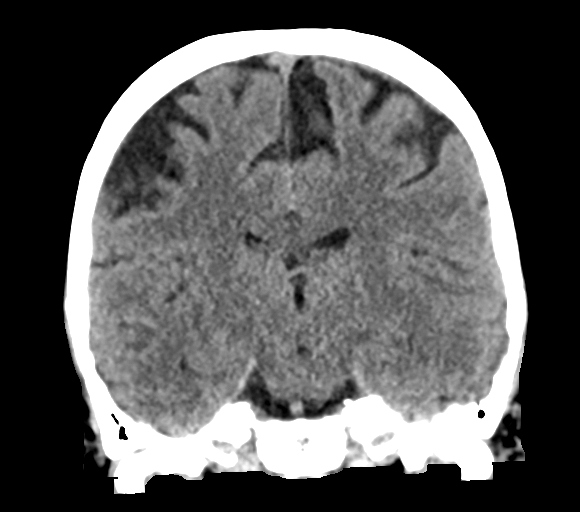
[im 36/65  brain]
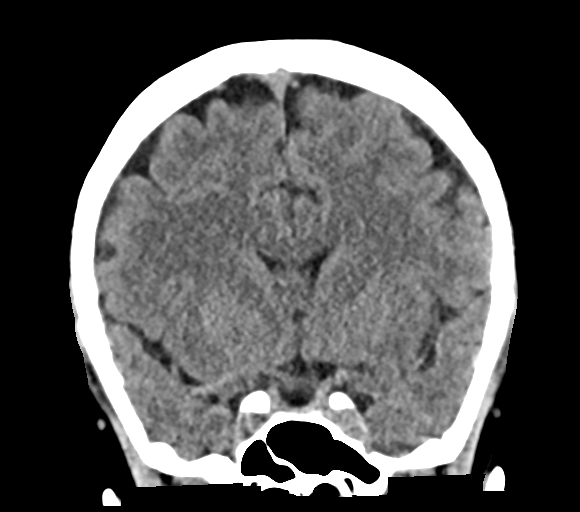

[Series 6: sag soft · sagittal · 0.29mm/px · 3 of 57 slices shown]
[im 19/57  brain]
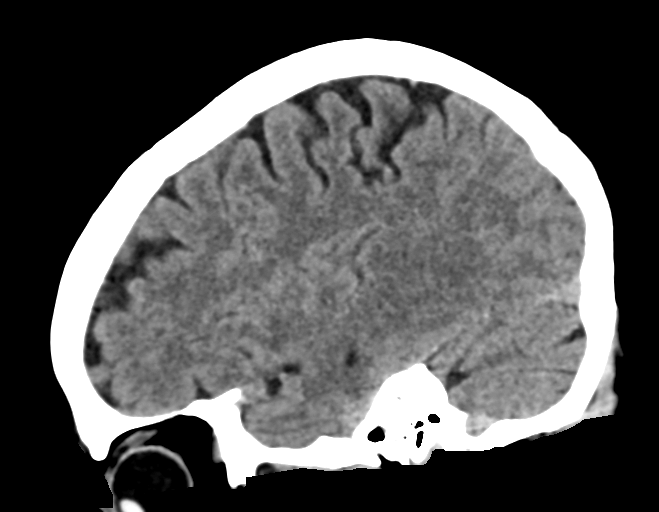
[im 29/57  brain]
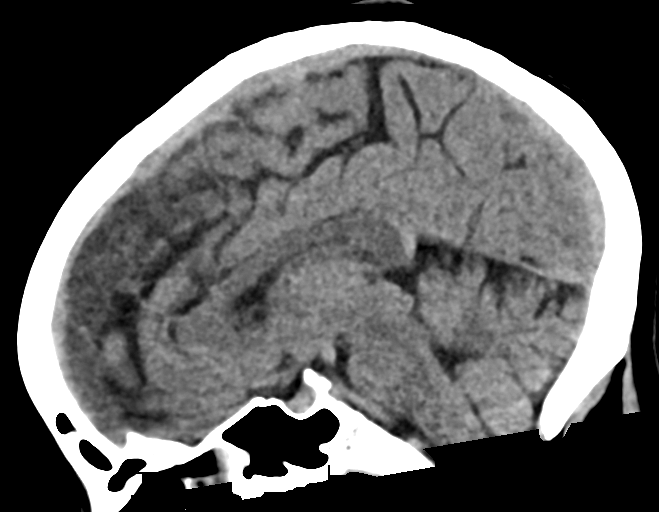
[im 38/57  brain]
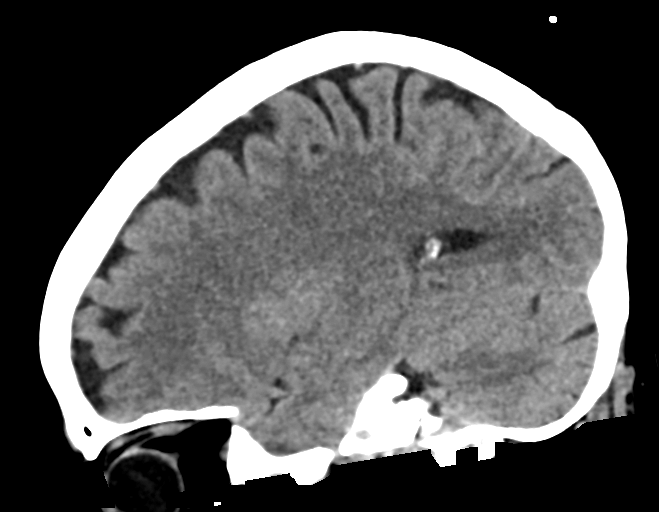

[16 of 47 positions shown; findings below may reference images not displayed]

FINDINGS: Brain: No evidence of acute large vascular territory infarction,
hemorrhage, hydrocephalus, extra-axial collection or mass
lesion/mass effect.

Vascular: No hyperdense vessel identified.

Skull: No acute fracture.

Sinuses/Orbits: Scattered paranasal sinus disease with opacification
of the right frontal sinus and scattered ethmoid air cells.
Unremarkable orbits.

Other: No mastoid effusions.

ASPECTS ([place] Stroke Program Early CT Score) total score (0-10
with 10 being normal): 10.
IMPRESSION: 1. No evidence of acute intracranial abnormality.
2. ASPECTS is 10
3. Frontoethmoidal paranasal sinus disease.

Dr. Giampierre was paged at the time of dictation.
